# Patient Record
Sex: Male | Born: 1970 | Race: Black or African American | Hispanic: No | Marital: Single | Smoking: Never smoker
Health system: Southern US, Community
[De-identification: ages and names within clinical notes are randomized; demographics above are authoritative.]

## PROBLEM LIST (undated history)

## (undated) HISTORY — PX: BACK SURGERY: SHX140

---

## 2005-04-26 ENCOUNTER — Ambulatory Visit: Payer: Self-pay | Admitting: Internal Medicine

## 2006-04-23 ENCOUNTER — Ambulatory Visit (HOSPITAL_COMMUNITY): Admission: RE | Admit: 2006-04-23 | Discharge: 2006-04-23 | Payer: Self-pay | Admitting: Neurosurgery

## 2007-01-15 ENCOUNTER — Ambulatory Visit (HOSPITAL_COMMUNITY): Admission: RE | Admit: 2007-01-15 | Discharge: 2007-01-15 | Payer: Self-pay | Admitting: Neurosurgery

## 2008-02-03 IMAGING — CR DG LUMBAR SPINE 2-3V
1 series · 1 of 1 positions shown · non-contrast
Comparison: none

CLINICAL DATA: Herniated disk L5-S1 for microdiskectomy.
LUMBAR SPINE ? 2 VIEW:

[view not recorded]
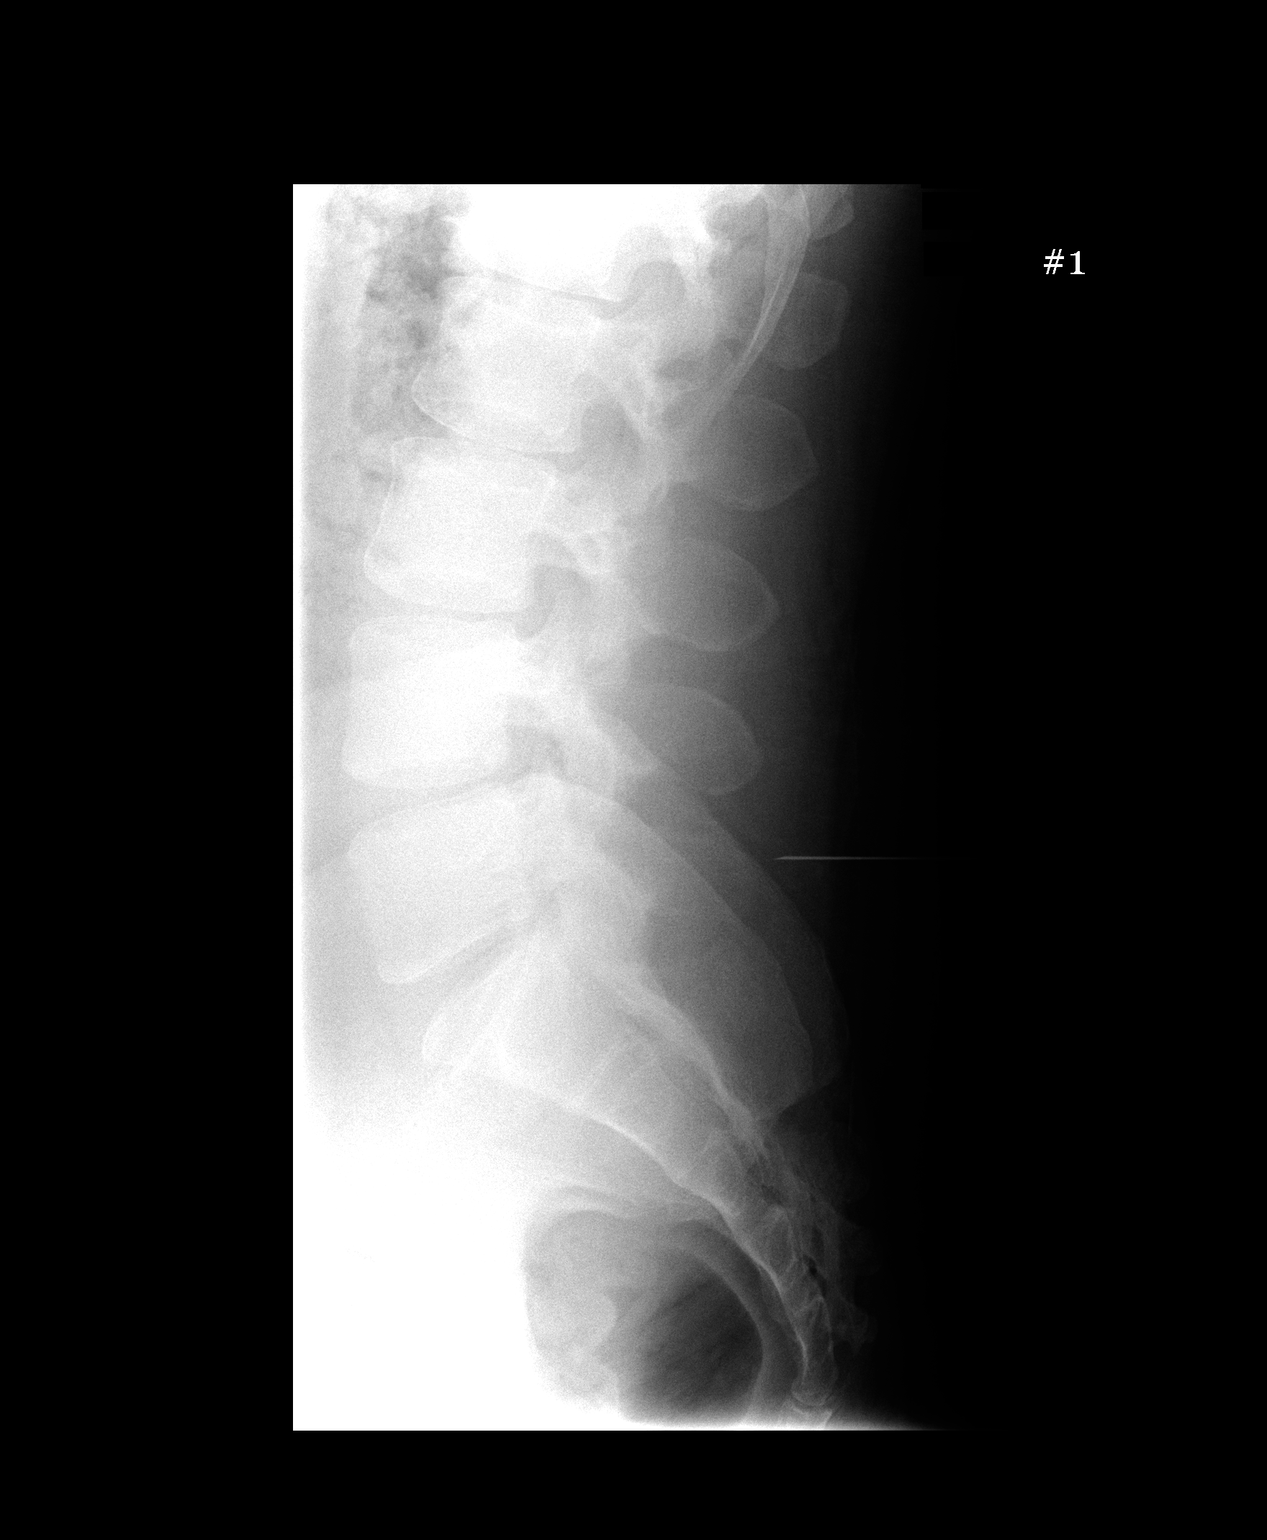

[1 of 1 positions shown; findings below may reference images not displayed]

FINDINGS: Intraoperative localization films demonstrate radiopaque localizer and  retractor at  L5-S1.
IMPRESSION: L5-S1 operative localization.

## 2009-12-24 ENCOUNTER — Emergency Department: Payer: Self-pay | Admitting: Emergency Medicine

## 2011-06-19 ENCOUNTER — Emergency Department: Payer: Self-pay | Admitting: Emergency Medicine

## 2012-07-07 IMAGING — CT CT HEAD WITHOUT CONTRAST
2 series · 16 of 30 positions shown, 20 images · non-contrast
Comparison: none

REASON FOR EXAM: dizziness
COMMENTS:

PROCEDURE:     CT  - CT HEAD WITHOUT CONTRAST  - June 19, 2011 [DATE]
RESULT:     Technique: Helical 5mm sections were obtained from the skull
base to the vertex without administration of intravenous contrast.

[Series 2: without · axial · non-contrast · 0.41mm/px · z∈[-98,+28]mm · 13 of 31 slices shown, 17 images]
[im 3/31  brain]
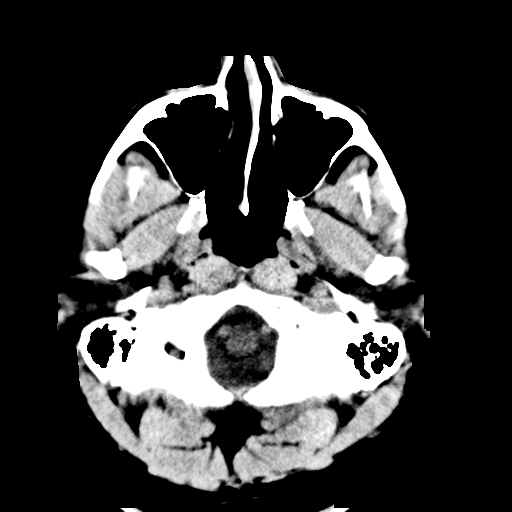
[im 3/31  bone]
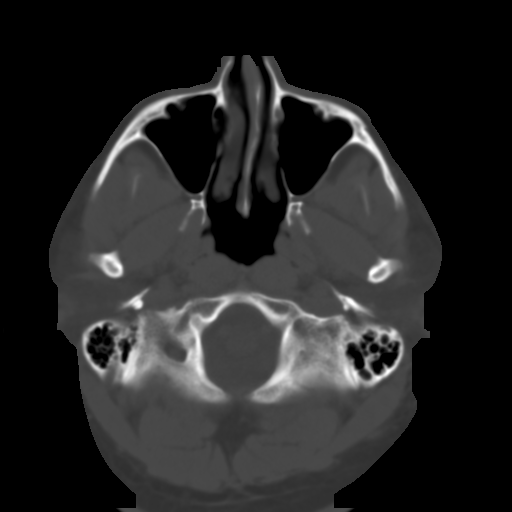
[im 5/31  brain]
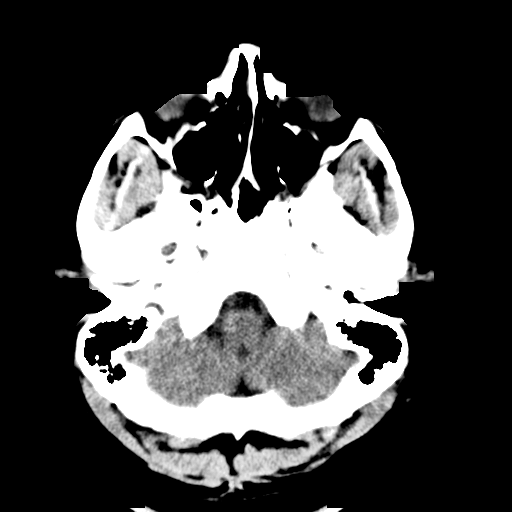
[im 7/31  brain]
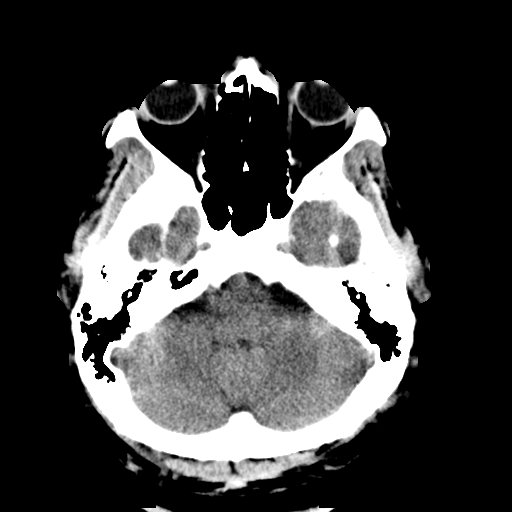
[im 9/31  brain]
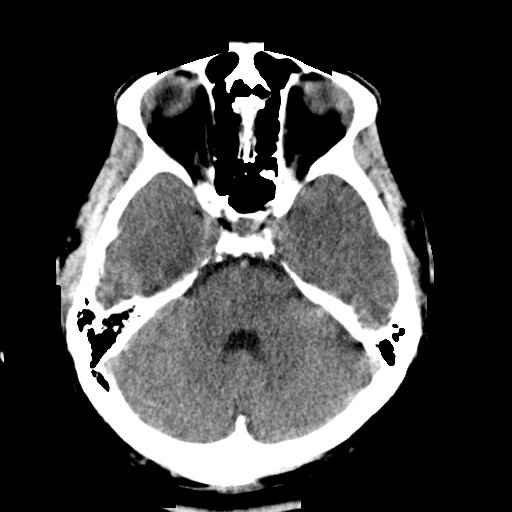
[im 11/31  brain]
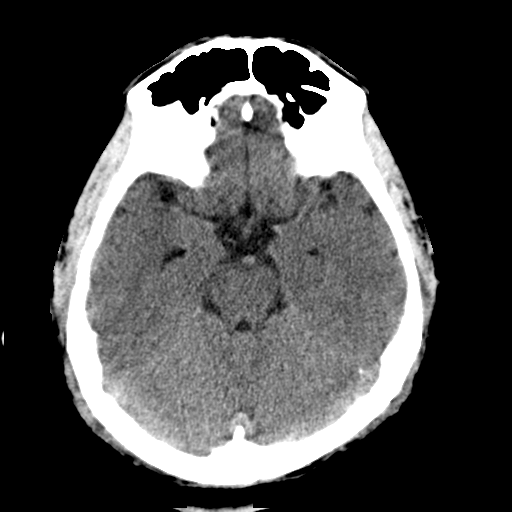
[im 11/31  bone]
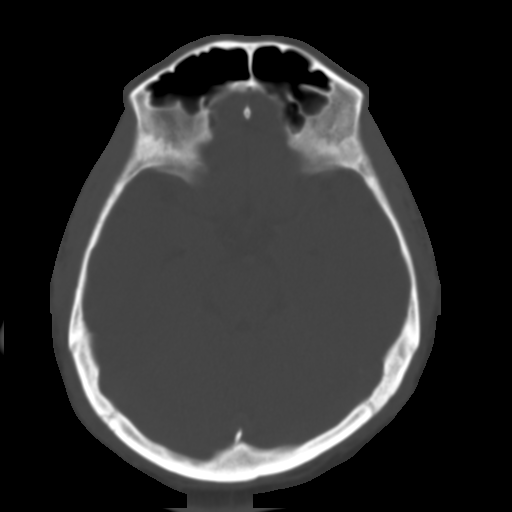
[im 13/31  brain]
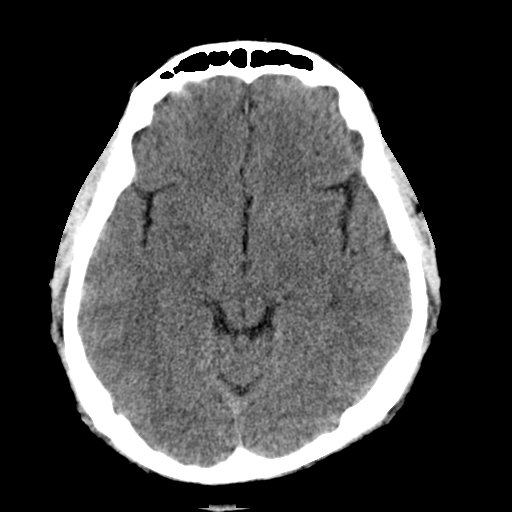
[im 16/31  brain]
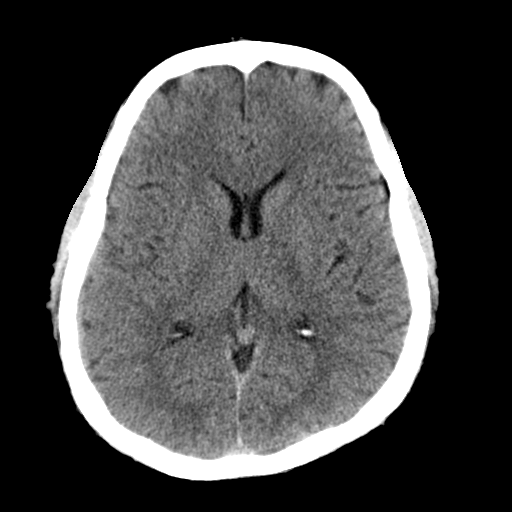
[im 18/31  brain]
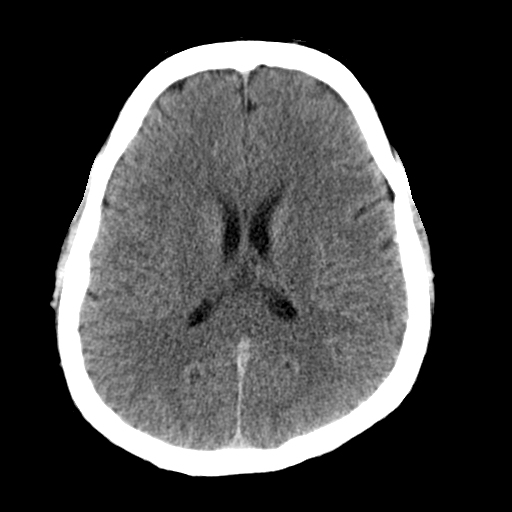
[im 20/31  brain]
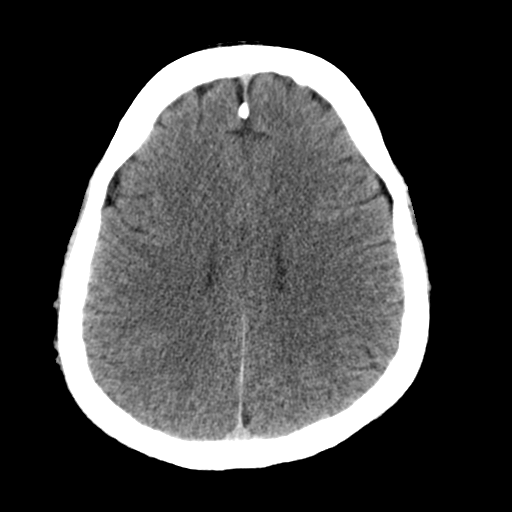
[im 20/31  bone]
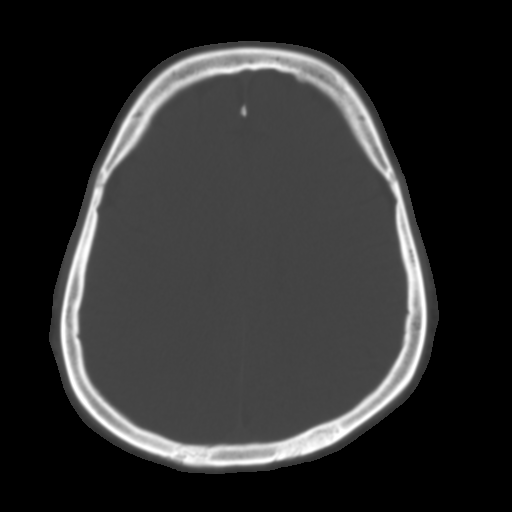
[im 22/31  brain]
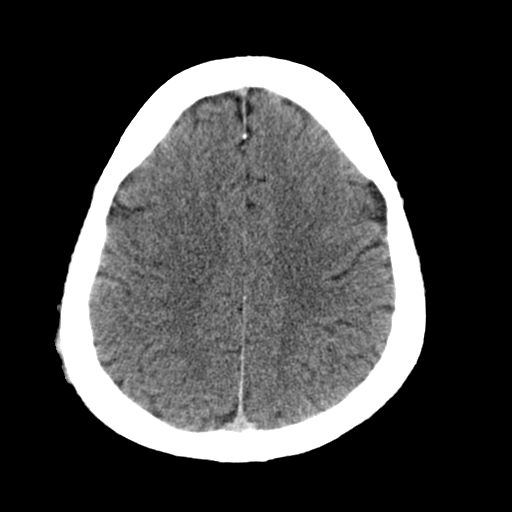
[im 24/31  brain]
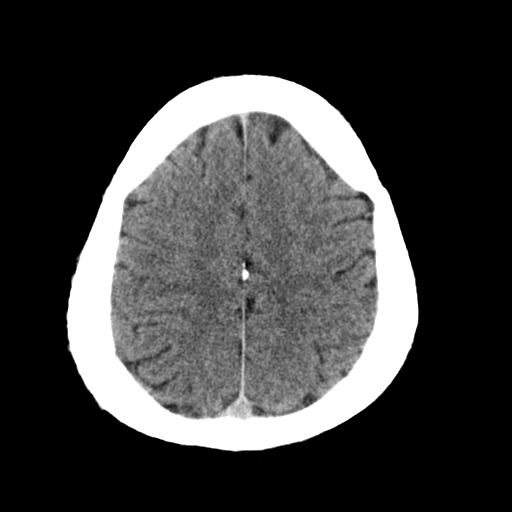
[im 26/31  brain]
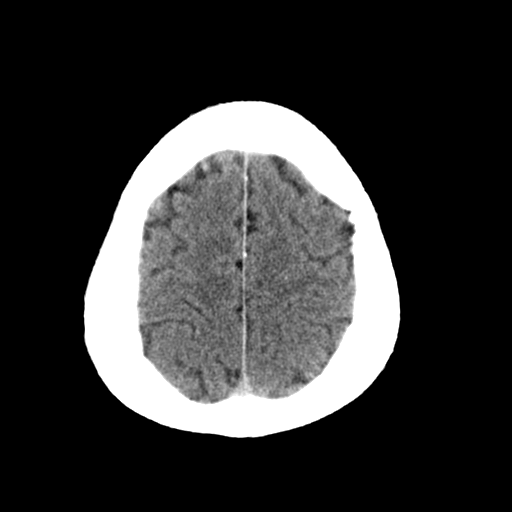
[im 28/31  brain]
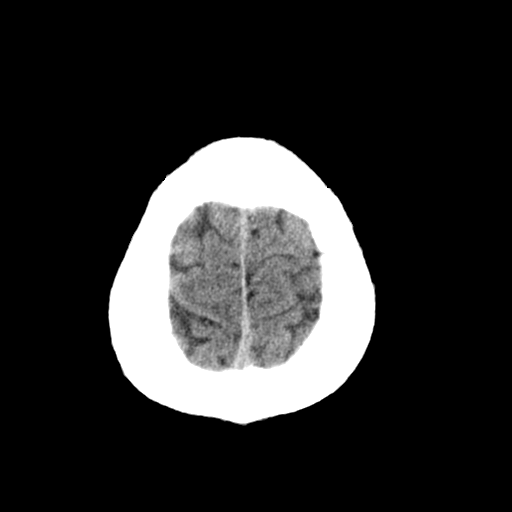
[im 28/31  bone]
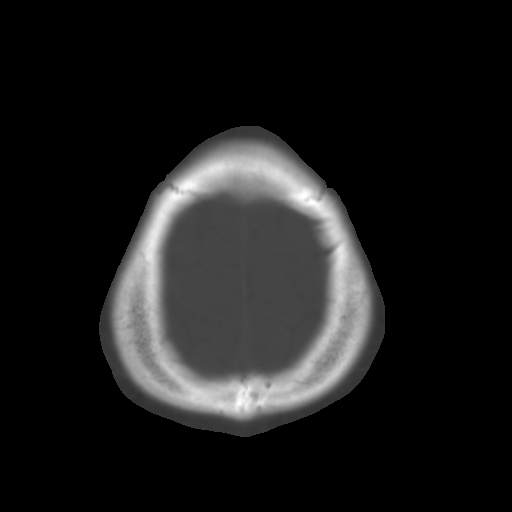

[Series 3: bone · axial · 0.41mm/px · z∈[-98,-58]mm · 3 of 31 slices shown]
[im 3/31  bone]
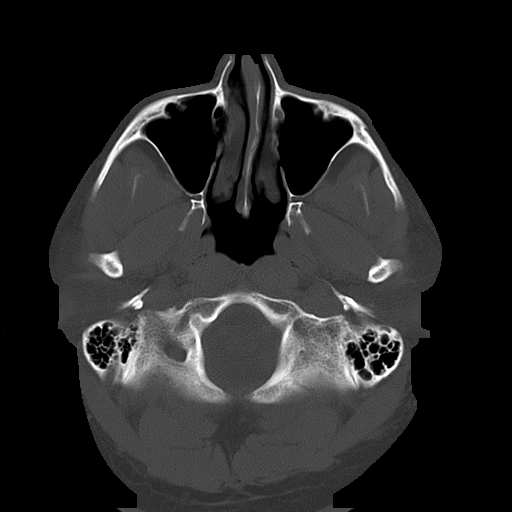
[im 7/31  bone]
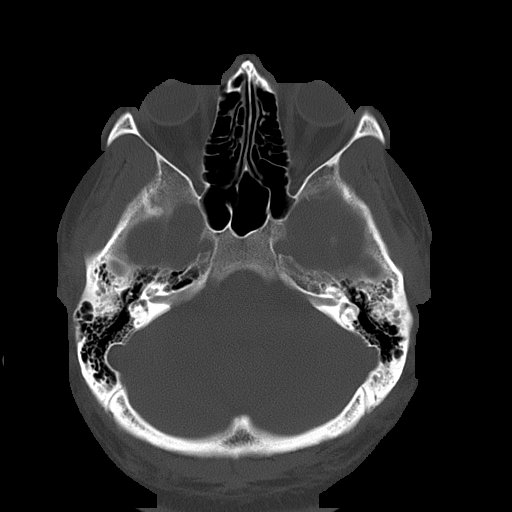
[im 11/31  bone]
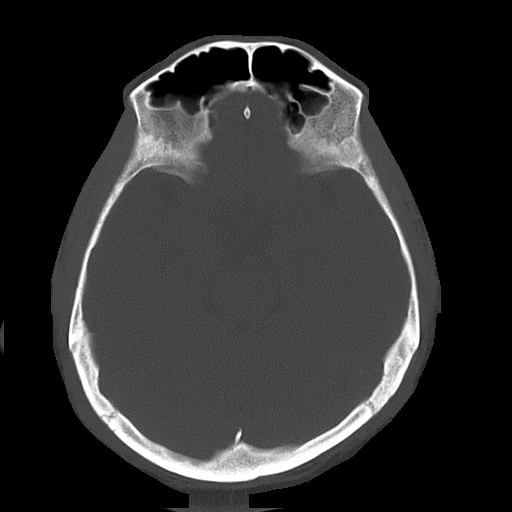

[16 of 30 positions shown; findings below may reference images not displayed]

FINDINGS: There is not evidence of intra-axial fluid collections. There is
no evidence of acute hemorrhage or secondary signs reflecting mass effect or
subacute or chronic focal territorial infarction. The osseous structures
demonstrate no evidence of a depressed skull fracture. If there is
persistent concern clinical follow-up with MRI is recommended.
IMPRESSION: 1. No evidence of acute intracranial abnormalitites.
2. Dr. Robichaud of the emergency department was informed of these findings via
a preliminary faxed report.

## 2013-11-23 ENCOUNTER — Emergency Department: Payer: Self-pay | Admitting: Emergency Medicine

## 2013-11-26 LAB — BETA STREP CULTURE(ARMC)

## 2015-12-08 DIAGNOSIS — Y998 Other external cause status: Secondary | ICD-10-CM | POA: Diagnosis not present

## 2015-12-08 DIAGNOSIS — Y92410 Unspecified street and highway as the place of occurrence of the external cause: Secondary | ICD-10-CM | POA: Insufficient documentation

## 2015-12-08 DIAGNOSIS — S0990XA Unspecified injury of head, initial encounter: Secondary | ICD-10-CM | POA: Insufficient documentation

## 2015-12-08 DIAGNOSIS — Y9389 Activity, other specified: Secondary | ICD-10-CM | POA: Insufficient documentation

## 2015-12-08 DIAGNOSIS — S199XXA Unspecified injury of neck, initial encounter: Secondary | ICD-10-CM | POA: Diagnosis not present
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
## 2015-12-09 ENCOUNTER — Emergency Department
Admission: EM | Admit: 2015-12-09 | Discharge: 2015-12-09 | Disposition: A | Discharge status: Home / Self Care | Payer: No Typology Code available for payment source | Attending: Emergency Medicine | Admitting: Emergency Medicine

## 2015-12-09 ENCOUNTER — Encounter: Payer: Self-pay | Admitting: Emergency Medicine

## 2015-12-09 DIAGNOSIS — M7918 Myalgia, other site: Secondary | ICD-10-CM

## 2015-12-09 MED ORDER — CYCLOBENZAPRINE HCL 10 MG PO TABS: 10.0000 mg | ORAL_TABLET | Freq: Three times a day (TID) | ORAL | Status: AC | PRN

## 2015-12-09 MED ORDER — CYCLOBENZAPRINE HCL 10 MG PO TABS: 5.0000 mg | ORAL_TABLET | Freq: Once | ORAL | Status: DC
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
## 2015-12-09 NOTE — ED Notes (Signed)
Reviewed d/c instructions, prescriptions, use of OTC tylenol/ibuprofen, and use of ice with pt. PT verbalized understanding.
